# Patient Record
Sex: Male | Born: 1995 | Race: White | Hispanic: No | Marital: Single | Smoking: Never smoker
Health system: Southern US, Community
[De-identification: ages and names within clinical notes are randomized; demographics above are authoritative.]

## PROBLEM LIST (undated history)

## (undated) DIAGNOSIS — J45909 Unspecified asthma, uncomplicated: Secondary | ICD-10-CM

## (undated) HISTORY — PX: CYST EXCISION: SHX5701

## (undated) HISTORY — PX: ADENOIDECTOMY: SUR15

## (undated) HISTORY — PX: TONSILLECTOMY: SUR1361

---
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
## 2013-08-11 ENCOUNTER — Encounter: Payer: Self-pay | Admitting: Emergency Medicine

## 2013-08-11 ENCOUNTER — Emergency Department (INDEPENDENT_AMBULATORY_CARE_PROVIDER_SITE_OTHER): Payer: BC Managed Care – PPO

## 2013-08-11 ENCOUNTER — Emergency Department
Admission: EM | Admit: 2013-08-11 | Discharge: 2013-08-11 | Disposition: A | Discharge status: Home / Self Care | Payer: BC Managed Care – PPO | Source: Home / Self Care | Attending: Family Medicine | Admitting: Family Medicine

## 2013-08-11 DIAGNOSIS — M545 Low back pain, unspecified: Secondary | ICD-10-CM

## 2013-08-11 DIAGNOSIS — M549 Dorsalgia, unspecified: Secondary | ICD-10-CM

## 2013-08-11 MED ORDER — MELOXICAM 15 MG PO TABS: 15.0000 mg | ORAL_TABLET | Freq: Every day | ORAL | Status: AC
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
## 2013-08-11 NOTE — ED Notes (Signed)
Bed: [id number] Expected date:  Expected time:  Means of arrival:  Comments: DOT

## 2013-08-11 NOTE — Discharge Instructions (Signed)
Begin back exercises. ° ° °Back Injury Prevention °Back injuries can be extremely painful and difficult to heal. After having one back injury, you are much more likely to experience another later on. It is important to learn how to avoid injuring or re-injuring your back. The following tips can help you to prevent a back injury. °PHYSICAL FITNESS °· Exercise regularly and try to develop good tone in your abdominal muscles. Your abdominal muscles provide a lot of the support needed by your back. °· Do aerobic exercises (walking, jogging, biking, swimming) regularly. °· Do exercises that increase balance and strength (tai chi, yoga) regularly. This can decrease your risk of falling and injuring your back. °· Stretch before and after exercising. °· Maintain a healthy weight. The more you weigh, the more stress is placed on your back. For every pound of weight, 10 times that amount of pressure is placed on the back. °DIET °· Talk to your caregiver about how much calcium and vitamin D you need per day. These nutrients help to prevent weakening of the bones (osteoporosis). Osteoporosis can cause broken (fractured) bones that lead to back pain. °· Include good sources of calcium in your diet, such as dairy products, green, leafy vegetables, and products with calcium added (fortified). °· Include good sources of vitamin D in your diet, such as milk and foods that are fortified with vitamin D. °· Consider taking a nutritional supplement or a multivitamin if needed. °· Stop smoking if you smoke. °POSTURE °· Sit and stand up straight. Avoid leaning forward when you sit or hunching over when you stand. °· Choose chairs with good low back (lumbar) support. °· If you work at a desk, sit close to your work so you do not need to lean over. Keep your chin tucked in. Keep your neck drawn back and elbows bent at a right angle. Your arms should look like the letter "L." °· Sit high and close to the steering wheel when you drive. Add a  lumbar support to your car seat if needed. °· Avoid sitting or standing in one position for too long. Take breaks to get up, stretch, and walk around at least once every hour. Take breaks if you are driving for long periods of time. °· Sleep on your side with your knees slightly bent, or sleep on your back with a pillow under your knees. Do not sleep on your stomach. °LIFTING, TWISTING, AND REACHING °· Avoid heavy lifting, especially repetitive lifting. If you must do heavy lifting: °· Stretch before lifting. °· Work slowly. °· Rest between lifts. °· Use carts and dollies to move objects when possible. °· Make several small trips instead of carrying 1 heavy load. °· Ask for help when you need it. °· Ask for help when moving big, awkward objects. °· Follow these steps when lifting: °· Stand with your feet shoulder-width apart. °· Get as close to the object as you can. Do not try to pick up heavy objects that are far from your body. °· Use handles or lifting straps if they are available. °· Bend at your knees. Squat down, but keep your heels off the floor. °· Keep your shoulders pulled back, your chin tucked in, and your back straight. °· Lift the object slowly, tightening the muscles in your legs, abdomen, and buttocks. Keep the object as close to the center of your body as possible. °· When you put a load down, use these same guidelines in reverse. °· Do not: °· Lift the   object above your waist.  Twist at the waist while lifting or carrying a load. Move your feet if you need to turn, not your waist.  Bend over without bending at your knees.  Avoid reaching over your head, across a table, or for an object on a high surface. OTHER TIPS  Avoid wet floors and keep sidewalks clear of ice to prevent falls.  Do not sleep on a mattress that is too soft or too hard.  Keep items that are used frequently within easy reach.  Put heavier objects on shelves at waist level and lighter objects on lower or higher  shelves.  Find ways to decrease your stress, such as exercise, massage, or relaxation techniques. Stress can build up in your muscles. Tense muscles are more vulnerable to injury.  Seek treatment for depression or anxiety if needed. These conditions can increase your risk of developing back pain. SEEK MEDICAL CARE IF:  You injure your back.  You have questions about diet, exercise, or other ways to prevent back injuries. MAKE SURE YOU:  Understand these instructions.  Will watch your condition.  Will get help right away if you are not doing well or get worse. Document Released: 08/15/2004 Document Revised: 09/30/2011 Document Reviewed: 08/19/2011 [facility] Patient Information 2014 [place], [place].

## 2013-08-12 ENCOUNTER — Telehealth: Payer: Self-pay | Admitting: Emergency Medicine

## 2013-08-16 ENCOUNTER — Encounter: Payer: BC Managed Care – PPO | Admitting: Sports Medicine

## 2013-08-17 ENCOUNTER — Ambulatory Visit (INDEPENDENT_AMBULATORY_CARE_PROVIDER_SITE_OTHER): Payer: BC Managed Care – PPO | Admitting: Sports Medicine

## 2013-08-17 ENCOUNTER — Encounter: Payer: Self-pay | Admitting: Sports Medicine

## 2013-08-17 VITALS — BP 115/68 | HR 78 | Ht 69.0 in | Wt 166.0 lb

## 2013-08-17 DIAGNOSIS — M545 Low back pain, unspecified: Secondary | ICD-10-CM

## 2013-08-17 MED ORDER — CYCLOBENZAPRINE HCL 10 MG PO TABS: ORAL_TABLET | ORAL | Status: AC

## 2013-08-17 NOTE — Progress Notes (Signed)
   Subjective:    I'm seeing this patient as a consultation for:  Dr. Larey BrickSamuel Ravenel  CC: Low back pain  HPI: This is a very pleasant 18 year old male who works at [employer], he was seen recently in urgent care, he had back pain that he localizes in the midline of the mid lumbar spine, there is no radiation, it's worse when bending forward, standing for long periods of time, but not worsened with Valsalva. He has no bowel or bladder dysfunction no constitutional symptoms. He was given some back pain medications, which have only been minimally effective, this was Mobic. Pain is moderate, persistent.  Past medical history, Surgical history, Family history not pertinant except as noted below, Social history, Allergies, and medications have been entered into the medical record, reviewed, and no changes needed.   Review of Systems: No headache, visual changes, nausea, vomiting, diarrhea, constipation, dizziness, abdominal pain, skin rash, fevers, chills, night sweats, weight loss, swollen lymph nodes, body aches, joint swelling, muscle aches, chest pain, shortness of breath, mood changes, visual or auditory hallucinations.   Objective:   General: Well Developed, well nourished, and in no acute distress.  Neuro/Psych: Alert and oriented x3, extra-ocular muscles intact, able to move all 4 extremities, sensation grossly intact. Skin: Warm and dry, no rashes noted.  Respiratory: Not using accessory muscles, speaking in full sentences, trachea midline.  Cardiovascular: Pulses palpable, no extremity edema. Abdomen: Does not appear distended. Back Exam:  Inspection: Unremarkable  Motion: Flexion 45 deg, Extension 45 deg, Side Bending to 45 deg bilaterally,  Rotation to 45 deg bilaterally  SLR laying: Negative  XSLR laying: Negative  Palpable tenderness: None. FABER: negative. Sensory change: Gross sensation intact to all lumbar and sacral dermatomes.  Reflexes: 2+ at both patellar tendons, 2+ at  achilles tendons, Babinski's downgoing.  Strength at foot  Plantar-flexion: 5/5 Dorsi-flexion: 5/5 Eversion: 5/5 Inversion: 5/5  Leg strength  Quad: 5/5 Hamstring: 5/5 Hip flexor: 5/5 Hip abductors: 5/5  Gait unremarkable.  Lumbar spine x-ray showed transitional vertebrae, at the very bottom level, L5-S1 there is a suspicion for a decrease in disc space.  Impression and Recommendations:   This case required medical decision making of moderate complexity.

## 2013-08-17 NOTE — Assessment & Plan Note (Signed)
Pain is axial only, worse with flexion and discogenic. He will continue meloxicam, I am adding Flexeril at bedtime and formal physical therapy. We will do this for a good 4-6 weeks before considering an MRI.

## 2013-08-25 ENCOUNTER — Ambulatory Visit (INDEPENDENT_AMBULATORY_CARE_PROVIDER_SITE_OTHER): Payer: BC Managed Care – PPO | Admitting: Physical Therapy

## 2013-08-25 DIAGNOSIS — M545 Low back pain, unspecified: Secondary | ICD-10-CM

## 2013-08-25 DIAGNOSIS — R293 Abnormal posture: Secondary | ICD-10-CM

## 2013-08-26 ENCOUNTER — Encounter: Payer: BC Managed Care – PPO | Admitting: Physical Therapy

## 2013-08-30 ENCOUNTER — Encounter: Payer: BC Managed Care – PPO | Admitting: Physical Therapy

## 2013-09-02 ENCOUNTER — Encounter: Payer: BC Managed Care – PPO | Admitting: Physical Therapy

## 2013-09-14 ENCOUNTER — Ambulatory Visit: Payer: BC Managed Care – PPO | Admitting: Sports Medicine

## 2013-09-14 DIAGNOSIS — Z0289 Encounter for other administrative examinations: Secondary | ICD-10-CM

## 2013-10-02 ENCOUNTER — Encounter: Payer: Self-pay | Admitting: Emergency Medicine

## 2013-10-02 ENCOUNTER — Emergency Department
Admission: EM | Admit: 2013-10-02 | Discharge: 2013-10-02 | Disposition: A | Discharge status: Home / Self Care | Payer: BC Managed Care – PPO | Source: Home / Self Care

## 2013-10-02 DIAGNOSIS — R112 Nausea with vomiting, unspecified: Secondary | ICD-10-CM

## 2013-10-02 LAB — POCT URINALYSIS DIP (MANUAL ENTRY)
GLUCOSE UA: NEGATIVE
LEUKOCYTES UA: NEGATIVE
NITRITE UA: NEGATIVE
Protein Ur, POC: 100
Spec Grav, UA: >=1.03 (ref 1.005–1.03)
UROBILINOGEN UA: 0.2 (ref 0–1)
pH, UA: 5.5 (ref 5–8)

## 2013-10-02 MED ORDER — ONDANSETRON HCL 4 MG PO TABS: 4.0000 mg | ORAL_TABLET | Freq: Once | ORAL | Status: DC

## 2013-10-02 MED ORDER — ONDANSETRON 4 MG PO TBDP: 4.0000 mg | ORAL_TABLET | Freq: Three times a day (TID) | ORAL | Status: AC | PRN

## 2013-10-02 MED ORDER — OMEPRAZOLE 20 MG PO CPDR: 20.0000 mg | DELAYED_RELEASE_CAPSULE | Freq: Every day | ORAL | Status: AC

## 2013-10-02 NOTE — ED Notes (Signed)
Reports nausea and vomiting starting this morning; has not kept food or beverage down; last episode 45 minutes ago. [facility].

## 2013-10-02 NOTE — ED Provider Notes (Signed)
CSN: [id number]     Arrival date & time 10/02/13  1539 History   None    Chief Complaint  Patient presents with  . Nausea  . Emesis    HPI  Nausea, vomiting x 1 day 5-6 episodes of non bloody, midly bilious vomiting.   Minimal abd pain. Mainly epigastric.  No fevers or chills.  No diarrhea.  No new/suspicious foods.  Did have several beers last night. Denies any hard liquor. Does not do this on a regular basis.    History reviewed. No pertinent past medical history. Past Surgical History  Procedure Laterality Date  . Tonsillectomy    . Adenoidectomy    . Cyst excision      Back   Family History  Problem Relation Age of Onset  . Kidney Stones Father    History  Substance Use Topics  . Smoking status: Never Smoker   . Smokeless tobacco: Current User  . Alcohol Use: No    Review of Systems  All other systems reviewed and are negative.    Allergies  Review of patient's allergies indicates no known allergies.  Home Medications   Current Outpatient Rx  Name  Route  Sig  Dispense  Refill  . cyclobenzaprine (FLEXERIL) 10 MG tablet      One half tab PO qHS, then increase gradually to one tab TID.   30 tablet   0   . meloxicam (MOBIC) 15 MG tablet   Oral   Take 1 tablet (15 mg total) by mouth daily. Take with food each evening   15 tablet   1   . ondansetron (ZOFRAN ODT) 4 MG disintegrating tablet   Oral   Take 1 tablet (4 mg total) by mouth every 8 (eight) hours as needed for nausea or vomiting.   20 tablet   0    BP 121/85  Pulse 115  Temp(Src) 97.6 F (36.4 C) (Oral)  Resp 16  Ht 5\' 9"  (1.753 m)  Wt 166 lb (75.297 kg)  BMI 24.50 kg/m2  SpO2 96% Physical Exam  Constitutional: He is oriented to person, place, and time. He appears well-developed and well-nourished.  HENT:  Head: Normocephalic and atraumatic.  Eyes: Conjunctivae are normal. Pupils are equal, round, and reactive to light.  Neck: Normal range of motion.  Cardiovascular: Normal  rate and regular rhythm.   Pulmonary/Chest: Effort normal and breath sounds normal.  Abdominal: Soft. Bowel sounds are normal.  Minimal/faint epigastric pain    Musculoskeletal: Normal range of motion.  Neurological: He is alert and oriented to person, place, and time.  Skin: Skin is warm.    ED Course  Procedures (including critical care time) Labs Review Labs Reviewed  POCT URINALYSIS DIP (MANUAL ENTRY)   Imaging Review No results found.   MDM   1. Nausea and vomiting    Sxs most consistent with viral process Prn zofran  There may be an element of ETOH induced gastritis.  Start PPI.  Discussed infectious and GI red flags.  Follow up as needed.     The patient and/or caregiver has been counseled thoroughly with regard to treatment plan and/or medications prescribed including dosage, schedule, interactions, rationale for use, and possible side effects and they verbalize understanding. Diagnoses and expected course of recovery discussed and will return if not improved as expected or if the condition worsens. Patient and/or caregiver verbalized understanding.          Doree AlbeeSteven Solon Alban, MD 10/02/13 [phone]

## 2014-11-11 IMAGING — CR DG LUMBAR SPINE COMPLETE 4+V
5 series · 5 of 5 positions shown · non-contrast
Comparison: None.

CLINICAL DATA: Back pain.  No injury.

EXAM:
LUMBAR SPINE - COMPLETE 4+ VIEW

[view not recorded (1 of 5)]
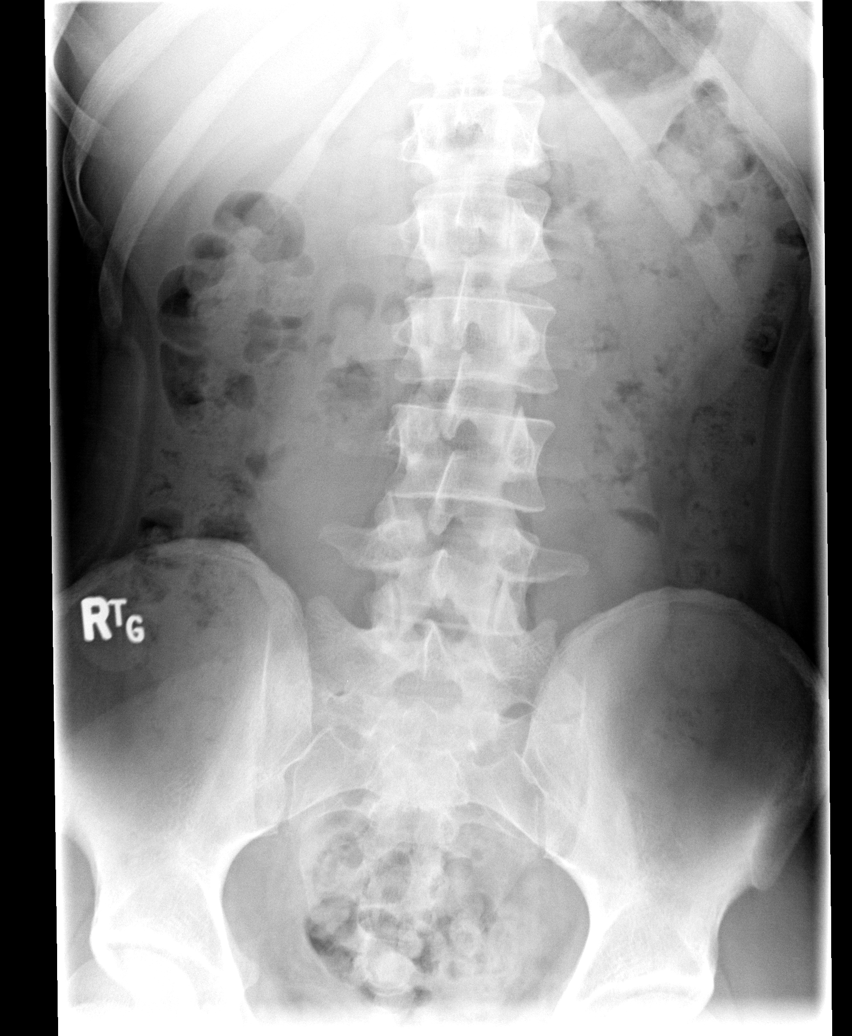

[view not recorded (2 of 5)]
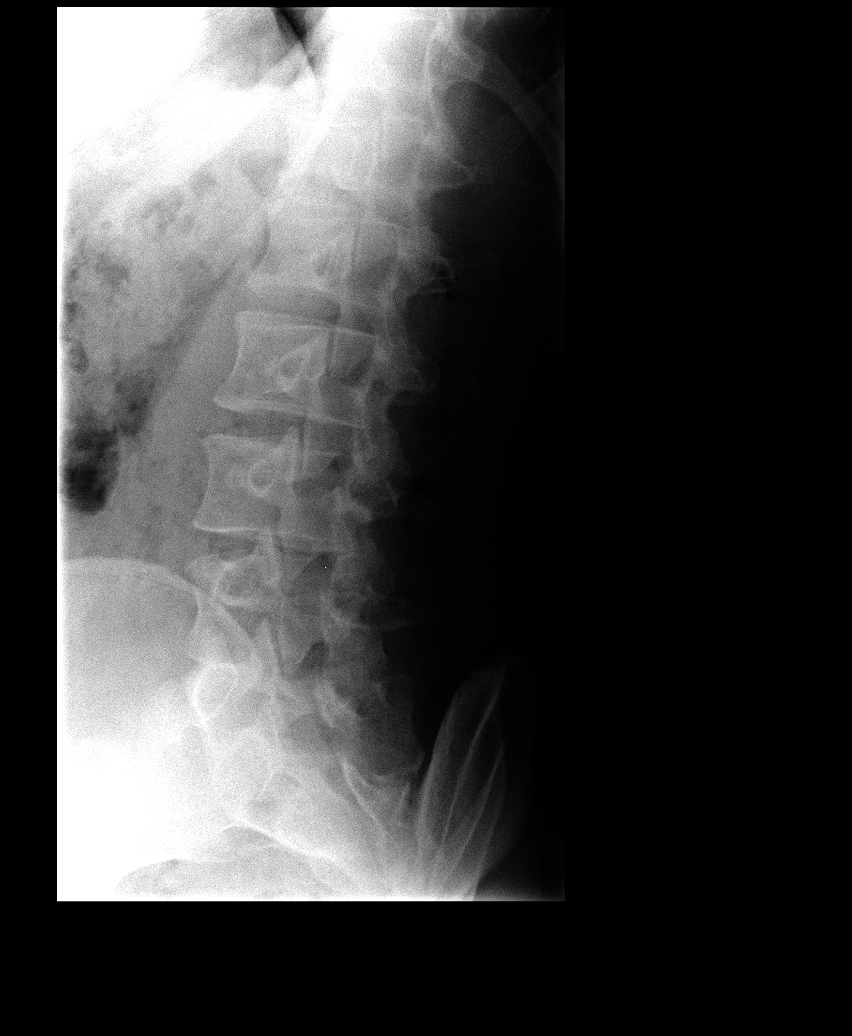

[view not recorded (3 of 5)]
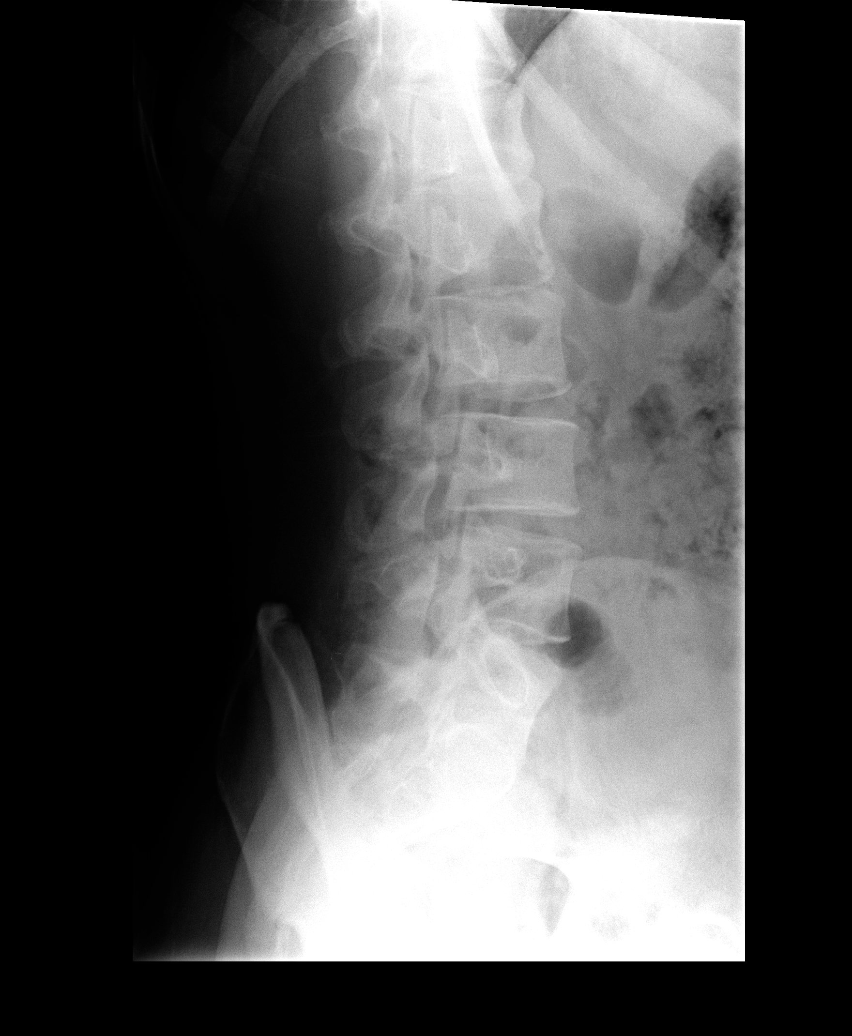

[view not recorded (4 of 5)]
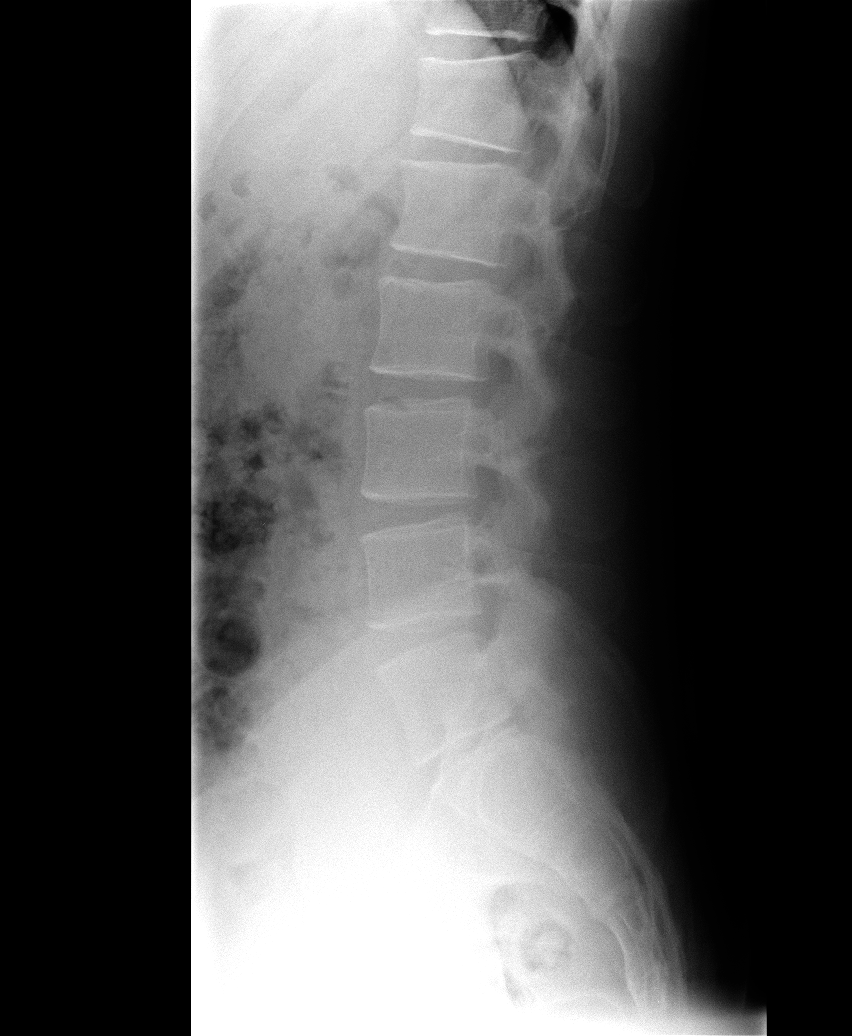

[view not recorded (5 of 5)]
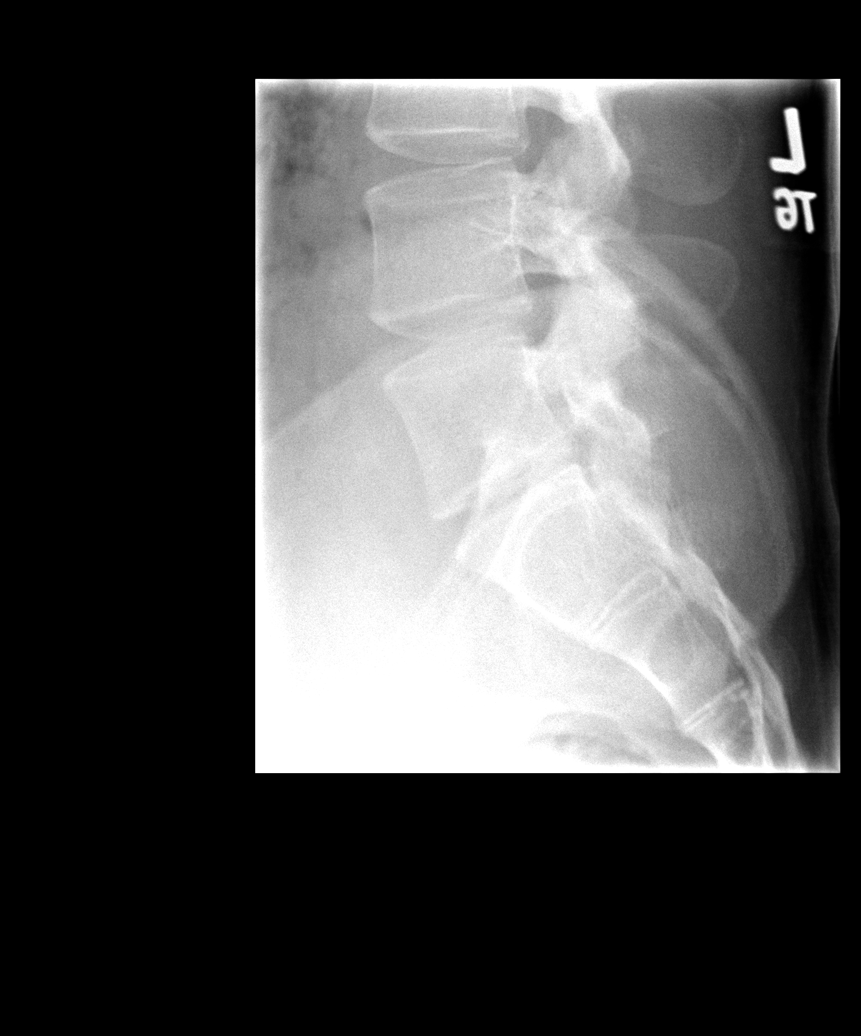

[5 of 5 positions shown; findings below may reference images not displayed]

FINDINGS: There is a transitional lumbosacral vertebrae. This is designated
L5. No degenerative changes are seen. There is a minor levoscoliosis
of the mid lumbar spine. The soft tissues are unremarkable.
IMPRESSION: Transitional lumbosacral vertebrae and minor levoscoliosis. No other
abnormalities.

## 2021-02-18 DIAGNOSIS — I4510 Unspecified right bundle-branch block: Secondary | ICD-10-CM

## 2021-05-15 ENCOUNTER — Emergency Department (HOSPITAL_COMMUNITY): Payer: Self-pay

## 2021-05-15 ENCOUNTER — Other Ambulatory Visit: Payer: Self-pay

## 2021-05-15 ENCOUNTER — Ambulatory Visit
Admission: EM | Admit: 2021-05-15 | Discharge: 2021-05-15 | Disposition: A | Discharge status: Home / Self Care | Payer: Self-pay

## 2021-05-15 ENCOUNTER — Encounter (HOSPITAL_COMMUNITY): Payer: Self-pay

## 2021-05-15 ENCOUNTER — Emergency Department (HOSPITAL_COMMUNITY)
Admission: EM | Admit: 2021-05-15 | Discharge: 2021-05-15 | Discharge status: Against Medical Advice | Payer: Self-pay | Attending: Emergency Medicine | Admitting: Emergency Medicine

## 2021-05-15 DIAGNOSIS — Z5321 Procedure and treatment not carried out due to patient leaving prior to being seen by health care provider: Secondary | ICD-10-CM | POA: Insufficient documentation

## 2021-05-15 DIAGNOSIS — R1310 Dysphagia, unspecified: Secondary | ICD-10-CM | POA: Insufficient documentation

## 2021-05-15 HISTORY — DX: Unspecified asthma, uncomplicated: J45.909

## 2021-05-15 NOTE — ED Provider Notes (Signed)
Emergency Medicine Provider Triage Evaluation Note  Jonathan Charles , a 25 y.o. male  was evaluated in triage.  Pt complains of foreign body stuck in his throat.  Patient states that earlier this afternoon he was eating chicken, and he feels like he swallowed a small chicken bone.  He has a foreign body sensation in his throat.  He says that he has pain when he swallows and it has worsened.  Review of Systems  Positive: Swallowing pain Negative: Drooling, shortness of breath, chest pain, neck swelling  Physical Exam  BP 130/90 (BP Location: Left Arm)   Pulse (!) 142   Temp 98.3 F (36.8 C) (Oral)   Resp 18   Ht 5\' 11"  (1.803 m)   Wt 83.9 kg   SpO2 98%   BMI 25.80 kg/m  Gen:   Awake, no distress   Resp:  Normal effort  MSK:   Moves extremities without difficulty  Other:  Could not visualize foreign body object with throat exam.  Palpated anterior neck with no discomfort.  Patient able to swallow normally.  Medical Decision Making  Medically screening exam initiated at 4:14 PM.  Appropriate orders placed.  Jonathan Charles was informed that the remainder of the evaluation will be completed by another provider, this initial triage assessment does not replace that evaluation, and the importance of remaining in the ED until their evaluation is complete.  X-ray for possible foreign body.     Jonathan Charles 05/15/21 1616    05/17/21, MD 05/15/21 [phone]

## 2021-05-15 NOTE — ED Notes (Signed)
Pt ambulatory in ED lobby. 

## 2021-05-15 NOTE — ED Triage Notes (Signed)
Patient states he was eating chicken at lunchg and thinks he swallowed a small chicken bone. Patient states he has pain when he swallows.  Patient is able to swallow his own saliva and is talking in complete sentences.

## 2022-08-15 IMAGING — CR DG NECK SOFT TISSUE
2 series · 2 of 2 positions shown · non-contrast
Comparison: None.

CLINICAL DATA: Evaluate for foreign body.  Chicken bone.

EXAM:
NECK SOFT TISSUES - 1+ VIEW

[w soft tissue neck lat]
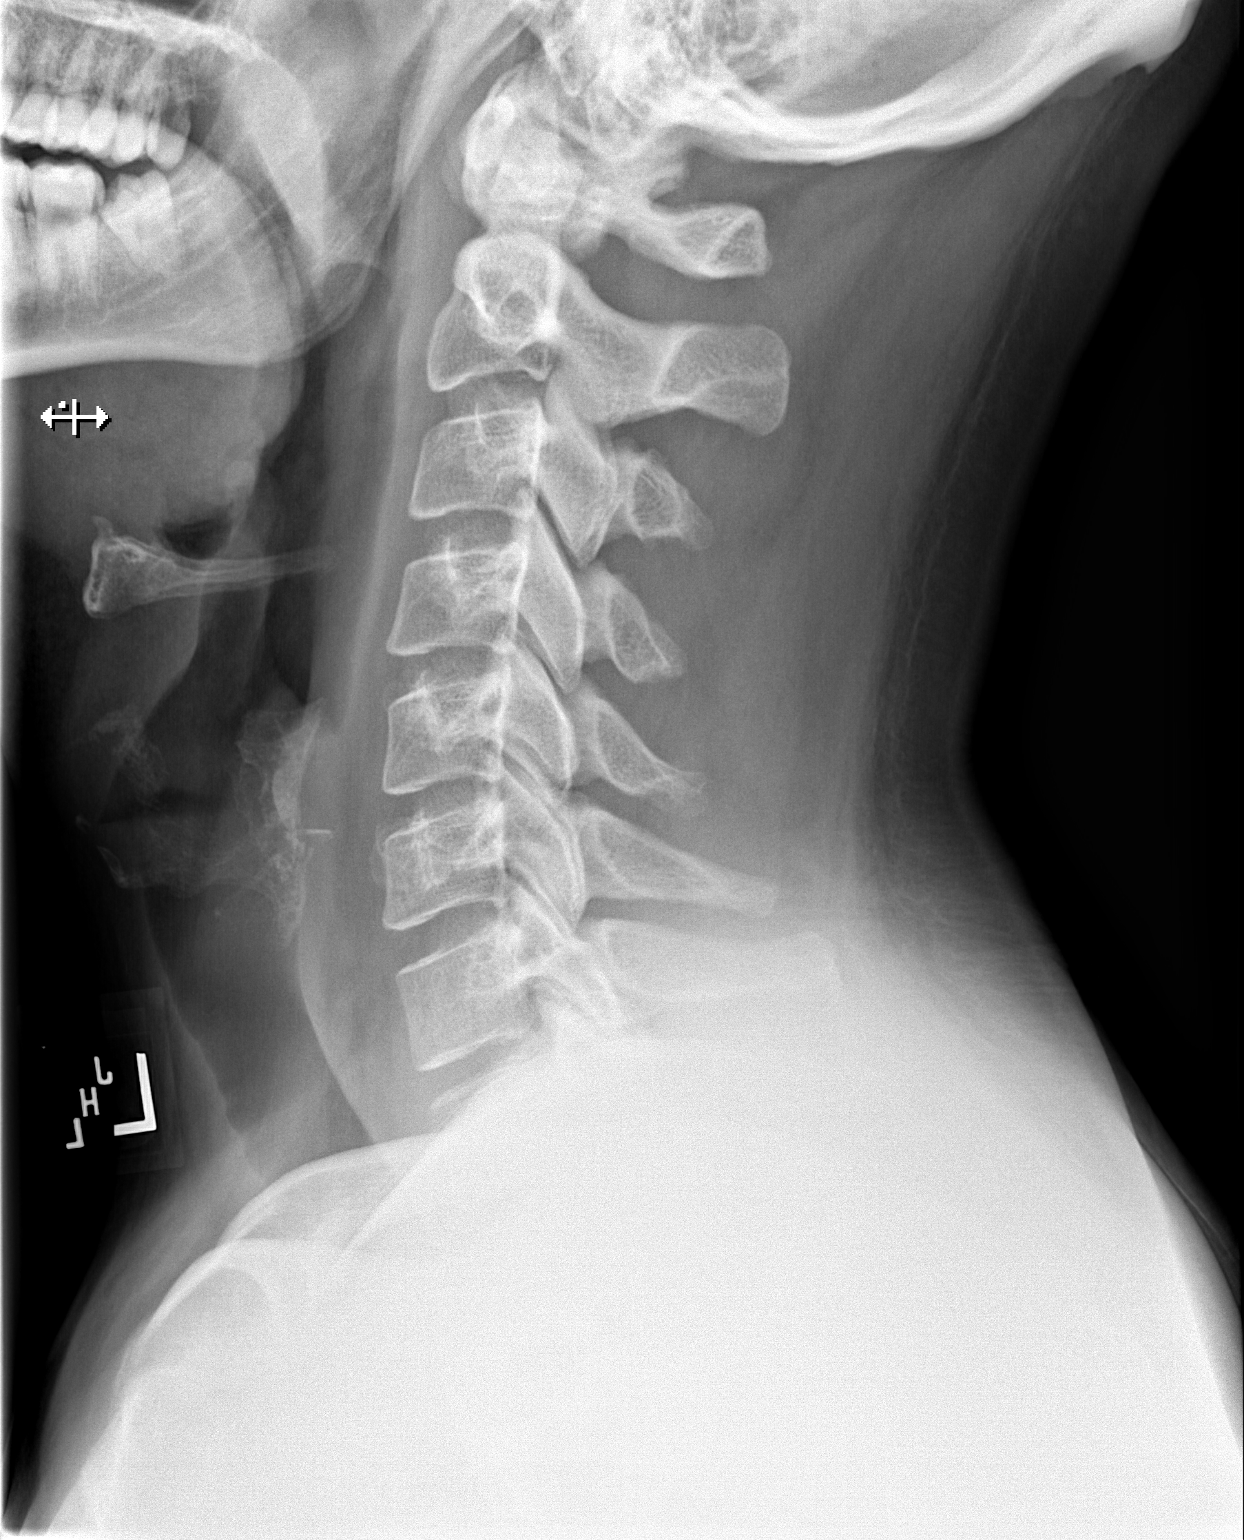

[w soft tissue neck ap]
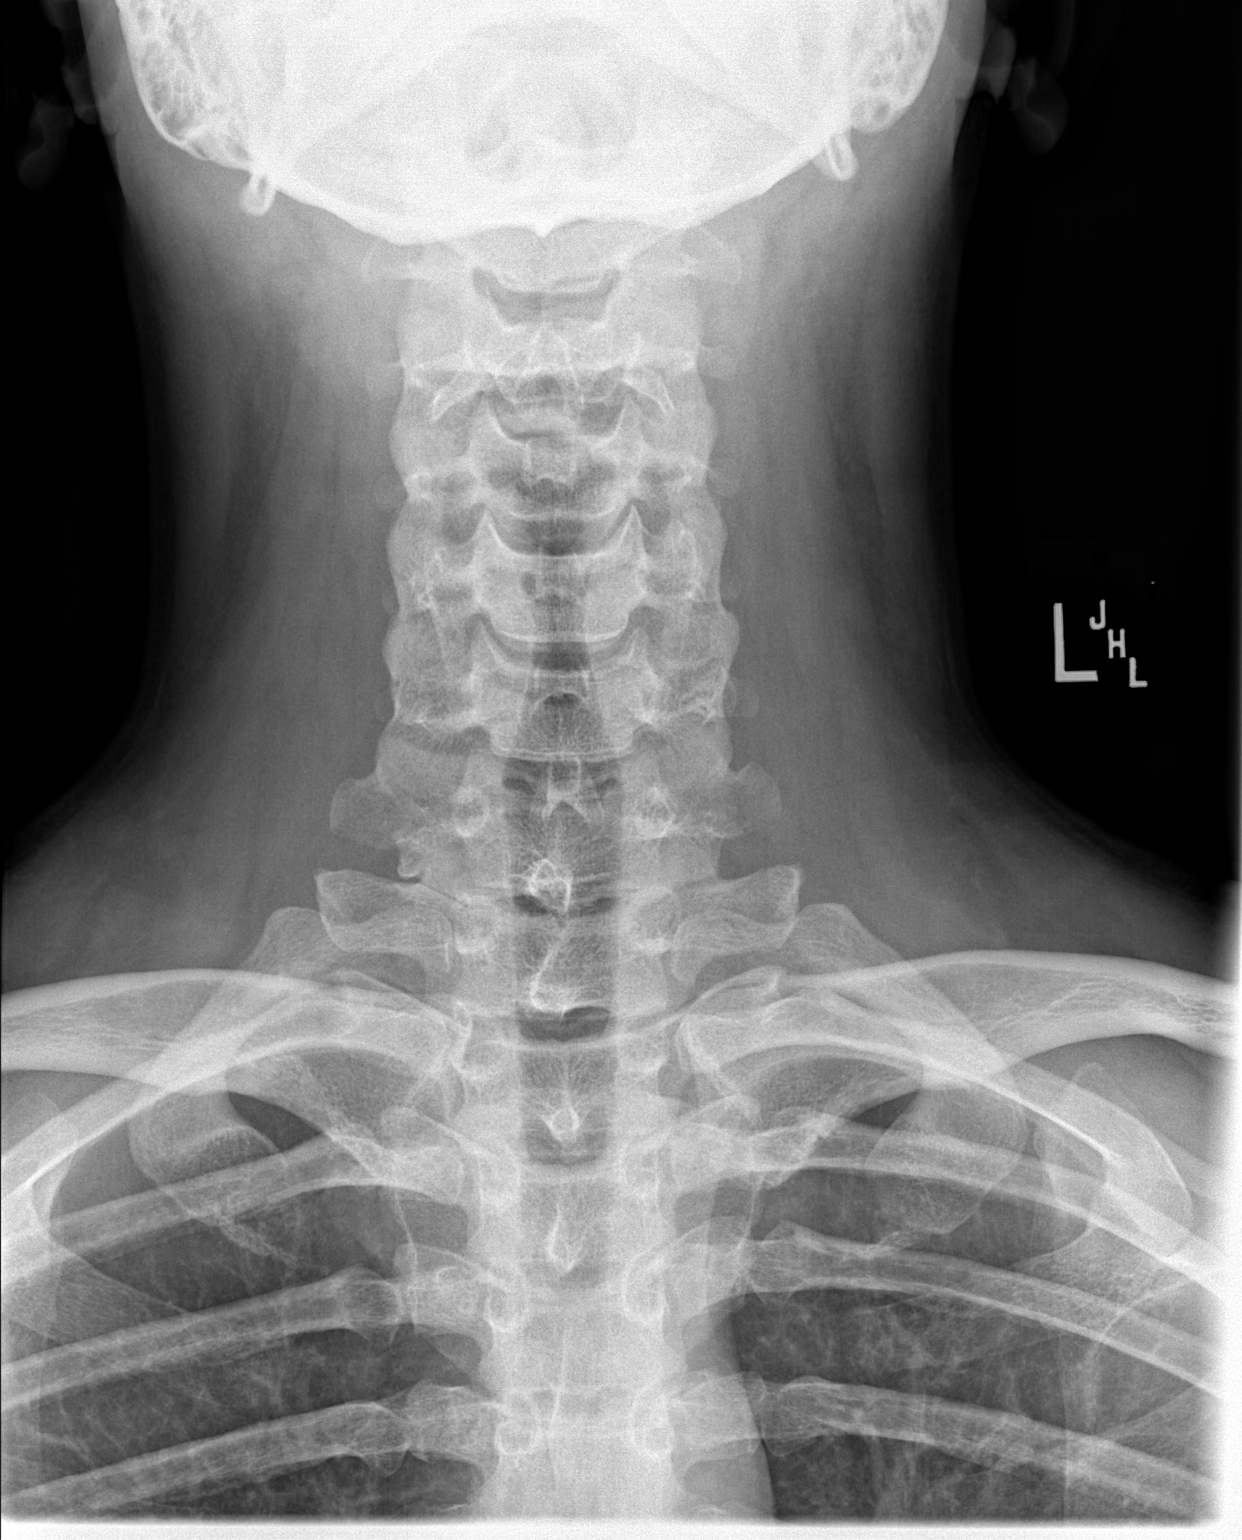

[2 of 2 positions shown; findings below may reference images not displayed]

FINDINGS: There is no evidence of retropharyngeal soft tissue swelling or
epiglottic enlargement. The cervical airway is unremarkable and no
radio-opaque foreign body identified.
IMPRESSION: Negative.
